# Patient Record
Sex: Male | Born: 2000 | Race: White | Hispanic: No | Marital: Single | Smoking: Never smoker
Health system: Southern US, Community
[De-identification: ages and names within clinical notes are randomized; demographics above are authoritative.]

## PROBLEM LIST (undated history)

## (undated) DIAGNOSIS — K219 Gastro-esophageal reflux disease without esophagitis: Secondary | ICD-10-CM

## (undated) HISTORY — DX: Gastro-esophageal reflux disease without esophagitis: K21.9

---
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
## 2018-07-16 ENCOUNTER — Encounter: Payer: Self-pay | Admitting: Orthopedic Surgery

## 2018-07-16 ENCOUNTER — Ambulatory Visit (INDEPENDENT_AMBULATORY_CARE_PROVIDER_SITE_OTHER): Payer: BLUE CROSS/BLUE SHIELD | Admitting: Orthopedic Surgery

## 2018-07-16 VITALS — BP 126/64 | HR 73 | Ht 68.75 in | Wt 153.0 lb

## 2018-07-16 DIAGNOSIS — S93432A Sprain of tibiofibular ligament of left ankle, initial encounter: Secondary | ICD-10-CM | POA: Diagnosis not present

## 2018-07-16 DIAGNOSIS — S93492A Sprain of other ligament of left ankle, initial encounter: Secondary | ICD-10-CM | POA: Diagnosis not present

## 2018-07-16 NOTE — Patient Instructions (Signed)
Ankle Sprain  An ankle sprain is a stretch or tear in one of the tough tissues (ligaments) that connect the bones in your ankle. An ankle sprain can happen when the ankle rolls outward (inversion sprain) or inward (eversion sprain). What are the causes? This condition is caused by rolling or twisting the ankle. What increases the risk? You are more likely to develop this condition if you play sports. What are the signs or symptoms? Symptoms of this condition include:  Pain in your ankle.  Swelling.  Bruising. This may happen right after you sprain your ankle or 1-2 days later.  Trouble standing or walking. How is this diagnosed? This condition is diagnosed with:  A physical exam. During the exam, your doctor will press on certain parts of your foot and ankle and try to move them in certain ways.  X-ray imaging. These may be taken to see how bad the sprain is and to check for broken bones. How is this treated? This condition may be treated with:  A brace or splint. This is used to keep the ankle from moving until it heals.  An elastic bandage. This is used to support the ankle.  Crutches.  Pain medicine.  Surgery. This may be needed if the sprain is very bad.  Physical therapy. This may help to improve movement in the ankle. Follow these instructions at home: If you have a brace or a splint:  Wear the brace or splint as told by your doctor. Remove it only as told by your doctor.  Loosen the brace or splint if your toes: ? Tingle. ? Lose feeling (become numb). ? Turn cold and blue.  Keep the brace or splint clean.  If the brace or splint is not waterproof: ? Do not let it get wet. ? Cover it with a watertight covering when you take a bath or a shower. If you have an elastic bandage (dressing):  Remove it to shower or bathe.  Try not to move your ankle much, but wiggle your toes from time to time. This helps to prevent swelling.  Adjust the dressing if it feels  too tight.  Loosen the dressing if your foot: ? Loses feeling. ? Tingles. ? Becomes cold and blue. Managing pain, stiffness, and swelling   Take over-the-counter and prescription medicines only as told by doctor.  For 2-3 days, keep your ankle raised (elevated) above the level of your heart.  If told, put ice on the injured area: ? If you have a removable brace or splint, remove it as told by your doctor. ? Put ice in a plastic bag. ? Place a towel between your skin and the bag. ? Leave the ice on for 20 minutes, 2-3 times a day. General instructions  Rest your ankle.  Do not use your injured leg to support your body weight until your doctor says that you can. Use crutches as told by your doctor.  Do not use any products that contain nicotine or tobacco, such as cigarettes, e-cigarettes, and chewing tobacco. If you need help quitting, ask your doctor.  Keep all follow-up visits as told by your doctor. Contact a doctor if:  Your bruises or swelling are quickly getting worse.  Your pain does not get better after you take medicine. Get help right away if:  You cannot feel your toes or foot.  Your foot or toes look blue.  You have very bad pain that gets worse. Summary  An ankle sprain is a stretch   or tear in one of the tough tissues (ligaments) that connect the bones in your ankle.  This condition is caused by rolling or twisting the ankle.  Symptoms include pain, swelling, bruising, and trouble walking.  To help with pain and swelling, put ice on the injured ankle, raise your ankle above the level of your heart, and use an elastic bandage. Also, rest as told by your doctor.  Keep all follow-up visits as told by your doctor. This is important. This information is not intended to replace advice given to you by your health care provider. Make sure you discuss any questions you have with your health care provider. Document Released: 12/12/2007 Document Revised: 11/19/2017  Document Reviewed: 11/19/2017 Elsevier Interactive Patient Education  2019 Elsevier Inc.  

## 2018-07-16 NOTE — Progress Notes (Signed)
NEW PATIENT OFFICE VISIT  Chief Complaint  Patient presents with  . 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  He did not feel a pop but had severe swelling and could not weight-bear at the time of injury.  He now comes in ambulating without a limp but continued pain swelling and inability to perform dance.  He has seen a doctor and a physical therapist but has been basically trying to do his own rehab  He has pain over the lateral and anterolateral ankle which is nonradiating localized moderate at times but primarily mild pain which is described as a dull ache   Review of Systems  Constitutional: Negative for chills and fever.  Neurological: Positive for tingling.     Past Medical History:  Diagnosis Date  . GERD (gastroesophageal reflux disease)     Past Surgical History:  Procedure Laterality Date  . HERNIA REPAIR  2000    Family History  Problem Relation Age of Onset  . Breast cancer Mother   . Breast cancer Maternal Grandmother   . Diabetes Maternal Grandmother   . Cancer Maternal Grandfather    Social History   Tobacco Use  . Smoking status: Never Smoker  . Smokeless tobacco: Never Used  Substance Use Topics  . Alcohol use: Never    Frequency: Never  . Drug use: Never    No Known Allergies  Current Meds  Medication Sig  . famotidine (PEPCID) 20 MG tablet Take 20 mg by mouth 2 (two) times daily.  Marland Kitchen ibuprofen (ADVIL,MOTRIN) 400 MG tablet Take 400 mg by mouth every 6 (six) hours as needed.    BP 126/64   Pulse 73   Ht 5' 8.75" (1.746 m)   Wt 153 lb (69.4 kg)   BMI 22.76 kg/m   Physical Exam Vitals signs reviewed.  Constitutional:      Appearance: He is well-developed.     Comments: Vital signs have been reviewed and are stable. Gen. appearance the patient is well-developed and well-nourished with normal grooming and hygiene.   Skin:  General: Skin is warm and dry.     Findings: No erythema.  Neurological:     Mental Status: He is alert and oriented to person, place, and time.     Gait: Gait abnormal.     Right Ankle Exam  Right ankle exam is normal.  Tenderness  The patient is experiencing no tenderness. Swelling: none  Range of Motion  The patient has normal right ankle ROM.  Muscle Strength  The patient has normal right ankle strength.  Tests  Anterior drawer: trace Varus tilt: negative  Other  Erythema: absent Scars: absent Sensation: normal Pulse: present    Left Ankle Exam   Tenderness  The patient is experiencing tenderness in the ATF (Toes are also tender and tib-fib interspace tender approximately up to the midshaft of the 2 bones).  Swelling: none  Range of Motion  Dorsiflexion: abnormal  Plantar flexion: normal  Eversion: abnormal  Inversion: normal   Muscle Strength  The patient has normal left ankle strength. Peroneal muscle:  4/5  Tests  Anterior drawer: trace Varus tilt: negative  Other  Erythema: absent Scars: absent Sensation: normal Pulse: present  Comments:  Ecchymosis is noted over the second and third metatarsophalangeal joints and there is pain with dorsi flexion and plantar flexion  MEDICAL DECISION SECTION  Xrays were done at Images of his foot and ankle and tib-fib were brought in from an outside facility  My independent reading of xrays:  The foot x-ray is negative  The ankle x-ray is normal  The tib-fib x-ray is normal    Encounter Diagnoses  Name Primary?  . Sprain of other ligament of left ankle, initial encounter Yes  . High ankle sprain of left lower extremity, initial encounter     PLAN: (Rx., injectx, surgery, frx, mri/ct) Rehabilitation with a physical therapist weight-bear as tolerated.  The patient was in a cam walker does not need that at this point work on dorsiflexion and proprioception follow-up as needed  No  orders of the defined types were placed in this encounter.   Fuller Canada, MD  07/17/2018 9:11 AM

## 2018-07-18 ENCOUNTER — Other Ambulatory Visit: Payer: Self-pay

## 2018-07-18 ENCOUNTER — Ambulatory Visit (HOSPITAL_COMMUNITY): Payer: BLUE CROSS/BLUE SHIELD | Attending: Orthopedic Surgery | Admitting: Physical Therapy

## 2018-07-18 ENCOUNTER — Encounter (HOSPITAL_COMMUNITY): Payer: Self-pay | Admitting: Physical Therapy

## 2018-07-18 DIAGNOSIS — M25672 Stiffness of left ankle, not elsewhere classified: Secondary | ICD-10-CM | POA: Diagnosis not present

## 2018-07-18 DIAGNOSIS — M25572 Pain in left ankle and joints of left foot: Secondary | ICD-10-CM | POA: Diagnosis present

## 2018-07-18 DIAGNOSIS — R29898 Other symptoms and signs involving the musculoskeletal system: Secondary | ICD-10-CM | POA: Diagnosis present

## 2018-07-18 NOTE — Patient Instructions (Signed)
Phase I . Isometric Sickle and wing - for first couple weeks for tissue heeling and to reduce swelling . Theraband exercises for dorsiflexion and plantarflexion . Ankle AROM exercises: ankle pumps, sickle/wing, ABCs . Using theraball bending and straightening knee . Compression for ankle swelling/ice . Avoid passive range into inversion or plantarflexion . Avoid jumping or other impact . Hip strengthening on mat or at bar Phase II . Passive ROM to gain last bit of range . Strengthening through full ROM progression from isometrics . Slowly progress plyometrics - Jumping progression: single leg relevae  double leg jumping  single leg jumping Phase III . Returning to all regular activity . Single leg jumping, single leg relevae  . Jogging

## 2018-07-18 NOTE — Therapy (Signed)
[facility] [address] [place], [place], [address] Phone: [phone]   Fax:  [phone]  Physical Therapy Evaluation  Patient Details  Name: Jesse Perkins MRN: [id number] Date of Birth: 2001-01-04 Referring Provider (PT): Fuller Canada MD   Encounter Date: 07/18/2018  PT End of Session - 07/18/18 1547    Visit Number  1    Number of Visits  1    Authorization Type  [employer] Other    Authorization Time Period  07/18/18    PT Start Time  1350    PT Stop Time  1440    PT Time Calculation (min)  50 min    Activity Tolerance  Patient tolerated treatment well;No increased pain    Behavior During Therapy  WFL for tasks assessed/performed       Past Medical History:  Diagnosis Date  . GERD (gastroesophageal reflux disease)     Past Surgical History:  Procedure Laterality Date  . HERNIA REPAIR  2000    There were no vitals filed for this visit.   Subjective Assessment - 07/18/18 1359    Subjective  Patient reported that he was dancing in the performance of the Nutcracker at [facility] in [facility], when he jumped up and landed on his foot which went inwards. Patient reported that his ankle swelled instantly, but that he didn't hear a pop. Patient reported that initially he was not able to walk on it, but 24 hours later he was able to walk on it. Patient stated that the outside and in the front of his shin is tender. Patient reported that it hurts a lot in the area where the retinaculum would be. He stated he used crutches for 1 day. Then didn't he wore the boot for about 3 days. He stated he has some bruising in between his toes. Patient reported he still has some swelling. Patient reported that he has been working with athletic trainers at the school to try and decrease the swelling.     Pertinent History  Injury of foot/ankle on 06/20/18    Limitations  Other (comment)   Dancing   How long can you sit comfortably?  Not limited     How long can you stand comfortably?  Not limited    How long can you walk comfortably?  Not limited    Diagnostic tests  X-rays negative for fracture    Patient Stated Goals  To return to dance    Currently in Pain?  No/denies         [facility] PT Assessment - 07/18/18 0001      Assessment   Medical Diagnosis  Left ankle sprain    Referring Provider (PT)  Fuller Canada MD    Onset Date/Surgical Date  06/20/18    Next MD Visit  Unknown    Prior Therapy  None      Restrictions   Weight Bearing Restrictions  No      Prior Function   Level of Independence  Independent;Independent with basic ADLs    Chartered certified accountant;Other (comment)   Dancer     Cognition   Overall Cognitive Status  Within Functional Limits for tasks assessed      Observation/Other Assessments   Observations  Noted swelling behind lateral malleolus of left ankle, bruising noted between 2nd and 3rd toe, no swelling noted in toes.      Observation/Other Assessments-Edema    Edema  Figure 8  Figure 8 Edema   Figure 8 - Right   51 cm    Figure 8 - Left   51.5 cm      Sensation   Light Touch  Appears Intact      ROM / Strength   AROM / PROM / Strength  AROM;Strength      AROM   AROM Assessment Site  Ankle    Right/Left Ankle  Right;Left    Right Ankle Dorsiflexion  15    Right Ankle Plantar Flexion  65    Right Ankle Inversion  25    Right Ankle Eversion  20    Left Ankle Plantar Flexion  55    Left Ankle Inversion  18    Left Ankle Eversion  13      Strength   Strength Assessment Site  Hip;Knee;Ankle    Right/Left Hip  --    Right/Left Knee  --    Right/Left Ankle  Right;Left    Right Ankle Dorsiflexion  5/5    Right Ankle Plantar Flexion  5/5    Right Ankle Inversion  5/5    Right Ankle Eversion  5/5    Left Ankle Dorsiflexion  5/5    Left Ankle Plantar Flexion  3-/5    Left Ankle Inversion  3-/5    Left Ankle Eversion  3-/5      Palpation   Palpation comment  Patient reported  tenderness to palpation behind lateral malleolus of left ankle, tenderness in anterior ankle      Special Tests    Special Tests  Ankle/Foot Special Tests    Ankle/Foot Special Tests   Anterior Drawer Test;Talar Tilt Test      Anterior Drawer Test   Findings  Negative    Comments  Also syndesmosis testing for high ankle sprain negative      Talar Tilt Test    Findings  Negative                Objective measurements completed on examination: See above findings.              PT Education - 07/18/18 1541    Education Details  Discussed examination findings, POC, and provided patient with a list of ideas for progression of exercises.    Person(s) Educated  Patient    Methods  Explanation;Handout    Comprehension  Verbalized understanding       PT Short Term Goals - 07/18/18 1544      PT SHORT TERM GOAL #1   Title  Patient will be educated on ideas for exercise progressions as well as on tissue healing times and how to protect tissues in order to continue performing progression of exercises at school with athletic trainers.     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 Upon evaluation patient demonstrated signs and symptoms consistent with an inversion ankle sprain including swelling, decreased ankle ROM, and decreased strength primarily in ankle inversion and plantarflexion. As patient is returning to school in [place] on Sunday, patient will not be able to  continue physical therapy. Patient requested to have ideas of progression of exercises in order to continue progressing with athletic trainers at school. Therapist educated patient on examination findings, about tissue healing  times, how to protect tissues as well as plans to progression and return to dance slowly and as tolerated. Therapist provided patient with a print out of general ideas for progression as well as provided the patient with the phone number for future contact if needed. Although, patient would benefit from physical therapy, this was a one time visit due to patient's schedule and returning to school.     History and Personal Factors relevant to plan of care:  Ankle sprain 06/20/18    Clinical Presentation  Stable    Clinical Presentation due to:  MMT, AROM, clinical judgement    Clinical Decision Making  Low    Rehab Potential  Good    PT Frequency  One time visit    PT Treatment/Interventions  ADLs/Self Care Home Management;Therapeutic exercise;Patient/family education    PT Next Visit Plan  One time visit    Recommended Other Services  Continue with athletic trainer and follow up with physician if not improving    Consulted and Agree with Plan of Care  Patient       Patient will benefit from skilled therapeutic intervention in order to improve the following deficits and impairments:  Decreased balance, Decreased endurance, Decreased mobility, Decreased range of motion, Increased edema, Decreased activity tolerance, Decreased strength, Pain  Visit Diagnosis: Stiffness of left ankle, not elsewhere classified  Pain in left ankle and joints of left foot  Other symptoms and signs involving the musculoskeletal system     Problem List There are no active problems to display for this patient.  Verne CarrowMacy Chiyo Fay PT, DPT 4:02 PM, 07/18/18 [phone]  [facility] [address], [place], [address] Phone: [phone]   Fax:  [phone]  Name: Jesse Perkins MRN: [id number] Date of Birth: [birth year]

## 2020-04-02 ENCOUNTER — Other Ambulatory Visit: Payer: Self-pay

## 2020-04-02 ENCOUNTER — Other Ambulatory Visit: Payer: BC Managed Care – PPO

## 2020-04-02 ENCOUNTER — Other Ambulatory Visit: Payer: Self-pay | Admitting: Internal Medicine

## 2020-04-02 DIAGNOSIS — Z20822 Contact with and (suspected) exposure to covid-19: Secondary | ICD-10-CM

## 2020-04-04 LAB — NOVEL CORONAVIRUS, NAA: SARS-CoV-2, NAA: NOT DETECTED

## 2020-04-04 LAB — SARS-COV-2, NAA 2 DAY TAT

## 2020-04-15 ENCOUNTER — Other Ambulatory Visit: Payer: BC Managed Care – PPO

## 2020-04-15 ENCOUNTER — Other Ambulatory Visit: Payer: Self-pay

## 2020-04-15 DIAGNOSIS — Z20822 Contact with and (suspected) exposure to covid-19: Secondary | ICD-10-CM

## 2020-04-16 LAB — SARS-COV-2, NAA 2 DAY TAT

## 2020-04-16 LAB — NOVEL CORONAVIRUS, NAA: SARS-CoV-2, NAA: NOT DETECTED

## 2022-02-04 NOTE — H&P (View-Only) (Signed)
Referring Provider:Self referred  Primary Care Physician:  Benita Stabile, MD Primary Gastroenterologist:  Dr. Jena Gauss  Chief Complaint  Patient presents with   Hemorrhoids    Constipation has a hard time having a bowel movement. Pressure in this prostate area. Taking MiraLax to help with bowel movement. Having trouble urinating. Bowel movements have white mucus.    HPI:   Jesse Perkins is a 21 y.o. male presenting today upon self referral for constipation, difficulty urinating, and prostate pressure.   He reports irregular BMs starting 7/18. Noticed internal hemorrhoids at that time as well per self internal exam. States he didn't have a BM for 4 days. When trying to have a bowel movement, states he would pass mucus only that would be clear, white in color, or sometimes blood-tinged mucus.  During this time, also developed difficulty urinating.  Having to strain.  Sometimes when standing to urinate, he had to sit, or vice versa.   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  [place] sea and he saw the doctor on board who did not have much to offer.  He then saw Dr. In the emergency room while in [place].  States today gave him a laxative and enema which allowed him to pass liquid stool.  He had associated generalized abdominal discomfort for couple of hours, but this resolved.  Doctor on board cruciated told him he had to go home to get cleared before coming back on board.  In transit to home, he was able to have a small BM.  He started using Preparation H for hemorrhoids.  States his hemorrhoids seem to have improved, no rectal pain, but he continues with pressure in his prostate area and difficulty urinating.  Denies dysuria or discharge.  He also started MiraLAX daily 3 to 4 days ago which has helped him have bowel movements that are soft and formed, but he still feels he is having incomplete evacuation.  Denies any further rectal bleeding.  He has no chronic history of constipation.  Reports  while on the group ship, it is hard to stay hydrated.  Otherwise, has not had any dietary changes.  He is very active.  He is sexually active with a male partner.  Reports he was sexually active on the cruise ship.  States his partner does not have any similar symptoms.  Reports he could have had some mucosal tearing/irritation related to intercourse, but nothing significant.  No abdominal pain, nausea, vomiting.   Father with history of IBS. No Fhx of colon cancer.   Past Medical History:  Diagnosis Date   GERD (gastroesophageal reflux disease)     Past Surgical History:  Procedure Laterality Date   HERNIA REPAIR  2000    Current Outpatient Medications  Medication Sig Dispense Refill   famotidine (PEPCID) 20 MG tablet Take 20 mg by mouth daily as needed.     ibuprofen (ADVIL,MOTRIN) 400 MG tablet Take 400 mg by mouth every 6 (six) hours as needed.     No current facility-administered medications for this visit.    Allergies as of 02/05/2022   (No Known Allergies)    Family History  Problem Relation Age of Onset   Breast cancer Mother    Breast cancer Maternal Grandmother    Diabetes Maternal Grandmother    Cancer Maternal Grandfather    Colon cancer Neg Hx     Social History   Socioeconomic History   Marital status: Single  Spouse name: Not on file   Number of children: Not on file   Years of education: Not on file   Highest education level: Not on file  Occupational History   Not on file  Tobacco Use   Smoking status: Never   Smokeless tobacco: Never  Substance and Sexual Activity   Alcohol use: Never   Drug use: Never   Sexual activity: Yes    Partners: Male    Birth control/protection: None  Other Topics Concern   Not on file  Social History Narrative   Not on file   Social Determinants of Health   Financial Resource Strain: Not on file  Food Insecurity: Not on file  Transportation Needs: Not on file  Physical Activity: Not on file  Stress:  Not on file  Social Connections: Not on file  Intimate Partner Violence: Not on file    Review of Systems: Gen: Denies any fever, chills, cold or flu like symptoms, pre-syncope, or syncope.   CV: Denies chest pain, heart palpitations. Resp: Denies shortness of breath, cough.  GI: See HPI GU : Denies urinary burning, urinary frequency, urinary hesitancy MS: Denies joint pain. Derm: Denies rash. Psych: Denies depression, anxiety. Heme: See HPI  Physical Exam: BP 116/75 (BP Location: Right Arm, Patient Position: Sitting, Cuff Size: Normal)   Pulse (!) 103   Temp 98.1 F (36.7 C) (Temporal)   Ht 5\' 9"  (1.753 m)   Wt 149 lb 3.2 oz (67.7 kg)   BMI 22.03 kg/m  General:   Alert and oriented. Pleasant and cooperative. Well-nourished and well-developed.  Head:  Normocephalic and atraumatic. Eyes:  Without icterus, sclera clear and conjunctiva pink.  Ears:  Normal auditory acuity. Lungs:  Clear to auscultation bilaterally. No wheezes, rales, or rhonchi. No distress.  Heart:  S1, S2 present without murmurs appreciated.  Abdomen:  +BS, soft, non-tender and non-distended. No HSM noted. No guarding or rebound. No masses appreciated.  Rectal:  No abnormalities on external exam, no anal fissure, possible internal hemorrhoids, mild bulge anteriorly that patient reports is uncomfortable to palpation described as a lot of pressure. Blood tinged mucous on gloved exam finger. No stool in rectal vault.  Msk:  Symmetrical without gross deformities. Normal posture. Extremities:  Without edema. Neurologic:  Alert and  oriented x4;  grossly normal neurologically. Skin:  Intact without significant lesions or rashes. Psych:  Normal mood and affect.    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   Patient developed new onset constipation on  7/18 while working on the cruise ship.  Also reports noticing internal hemorrhoids during this time, couple episodes of passing blood tinged mucous per rectum while unable to have a BM, pressure in the prostate area, and difficulty urinating without dysuria or abnormal urethral discharge. He has been using preparation H for hemorrhoids with improvement in hemorrhoid symptoms, MiraLAX daily for the last 3 days with some improvement in constipation, but still with incomplete bowel movement. Despite bowels movements, he has not had any improvement in pressure in his prostate or dysuria. On DRE, he has no external abnormalities, no stool in rectal vault, possible small internal hemorrhoids, and mild bulge anteriorly that patient reports is uncomfortable to palpation described as a lot of pressure. I suspect I am palpating his prostate. On gloved exam finger, there was blood tinged mucous. Upon further discussion, patient reports  he is sexually active with a male including recent activity while on the cruise ship that he reports may have resulted in some mucosal tearing/mild irritation, but nothing significant. He is not using protection.   His constipation may have been influenced by mild dehydration as he reports it is difficult to stay hydrated while on the cruise ship. Rectal bleeding may be secondary to hemorrhoids, anorectal trauma in the setting of anal intercourse. Unable to rule out other etiologies such as malignancy, but I feel this is less likely. My primary concern today is for possible prostatitis contributing to prostate tenderness and urinary symptoms.   I will plan to update blood work, check UA, culture, and gram stain; however, as I do not treat prostatitis, advised patient call his PCP today to discuss further. I am not comfortable initiating empiric antibiotics as he is at risk for gonorrhea/chlamydial infection.  Regarding his constipation, recommended increasing MiraLAX to twice daily dosing.    I offered a colonoscopy to evaluate his rectal bleeding, but patient requested to think on this and would let me know his decision in the coming days. For now, he can continue Preparation H as needed.    Plan:  CBC, BMP, UA, urine culture, and urine gram stain Increase MiraLAX to 17 g twice daily in 8 oz of water.  Drink at least 64 ox of water daily.  Continue preparation H twice daily as needed for hemorrhoids.  Patient will let me know if he would like to proceed with a colonoscopy.  He is to call his PCP today to discuss concerns regarding prostatitis/urinary issues.  Requested 1 week progress report on constipation.    Ermalinda Memos, PA-C [facility] 02/05/2022

## 2022-02-04 NOTE — Progress Notes (Unsigned)
   Referring Provider:Self referred  Primary Care Physician:  Benita Stabile, MD Primary Gastroenterologist:  Dr. Jena Gauss  No chief complaint on file.   HPI:   Jesse Perkins is a 21 y.o. male presenting today upon self referral for constipation and rectal pain.     Past Medical History:  Diagnosis Date   GERD (gastroesophageal reflux disease)     Past Surgical History:  Procedure Laterality Date   HERNIA REPAIR  2000    Current Outpatient Medications  Medication Sig Dispense Refill   famotidine (PEPCID) 20 MG tablet Take 20 mg by mouth 2 (two) times daily.     ibuprofen (ADVIL,MOTRIN) 400 MG tablet Take 400 mg by mouth every 6 (six) hours as needed.     No current facility-administered medications for this visit.    Allergies as of 02/05/2022   (No Known Allergies)    Family History  Problem Relation Age of Onset   Breast cancer Mother    Breast cancer Maternal Grandmother    Diabetes Maternal Grandmother    Cancer Maternal Grandfather     Social History   Socioeconomic History   Marital status: Single    Spouse name: Not on file   Number of children: Not on file   Years of education: Not on file   Highest education level: Not on file  Occupational History   Not on file  Tobacco Use   Smoking status: Never   Smokeless tobacco: Never  Substance and Sexual Activity   Alcohol use: Never   Drug use: Never   Sexual activity: Not on file  Other Topics Concern   Not on file  Social History Narrative   Not on file   Social Determinants of Health   Financial Resource Strain: Not on file  Food Insecurity: Not on file  Transportation Needs: Not on file  Physical Activity: Not on file  Stress: Not on file  Social Connections: Not on file  Intimate Partner Violence: Not on file    Review of Systems: Gen: Denies any fever, chills, cold or flu like symptoms, pre-syncope, or syncope.   CV: Denies chest pain, heart palpitations. Resp: Denies shortness of  breath, cough.  GI: See HPI GU : Denies urinary burning, urinary frequency, urinary hesitancy MS: Denies joint pain. Derm: Denies rash. Psych: Denies depression, anxiety. Heme: See HPI  Physical Exam: There were no vitals taken for this visit. General:   Alert and oriented. Pleasant and cooperative. Well-nourished and well-developed.  Head:  Normocephalic and atraumatic. Eyes:  Without icterus, sclera clear and conjunctiva pink.  Ears:  Normal auditory acuity. Lungs:  Clear to auscultation bilaterally. No wheezes, rales, or rhonchi. No distress.  Heart:  S1, S2 present without murmurs appreciated.  Abdomen:  +BS, soft, non-tender and non-distended. No HSM noted. No guarding or rebound. No masses appreciated.  Rectal:  Deferred  Msk:  Symmetrical without gross deformities. Normal posture. Extremities:  Without edema. Neurologic:  Alert and  oriented x4;  grossly normal neurologically. Skin:  Intact without significant lesions or rashes. Psych:  Normal mood and affect.    Assessment:     Plan:  ***   Ermalinda Memos, PA-C [facility] 02/05/2022

## 2022-02-05 ENCOUNTER — Encounter: Payer: Self-pay | Admitting: Gastroenterology

## 2022-02-05 ENCOUNTER — Ambulatory Visit (INDEPENDENT_AMBULATORY_CARE_PROVIDER_SITE_OTHER): Payer: BC Managed Care – PPO | Admitting: Gastroenterology

## 2022-02-05 VITALS — BP 116/75 | HR 103 | Temp 98.1°F | Ht 69.0 in | Wt 149.2 lb

## 2022-02-05 DIAGNOSIS — R39198 Other difficulties with micturition: Secondary | ICD-10-CM

## 2022-02-05 DIAGNOSIS — K5900 Constipation, unspecified: Secondary | ICD-10-CM

## 2022-02-05 DIAGNOSIS — R6889 Other general symptoms and signs: Secondary | ICD-10-CM

## 2022-02-05 DIAGNOSIS — K625 Hemorrhage of anus and rectum: Secondary | ICD-10-CM

## 2022-02-05 NOTE — Patient Instructions (Signed)
Please have blood work and urine testing completed at [facility].  Please call Dr. Margo Aye today to follow-up on suspected prostatitis (inflammation of your prostate). You may need a course of antibiotics.   Increase MiraLAX to 17 g in 8 ounces of water twice daily to help with your constipation.   Be sure to drink at least 64 ounces of water daily.  You may continue to use Preparation H twice daily for hemorrhoids.  Please let me know if you would like to proceed with a colonoscopy.  Call me in 1 week with a progress report on your constipation.  It was nice meeting you today!   Ermalinda Memos, PA-C [facility]

## 2022-02-06 LAB — CBC WITH DIFFERENTIAL/PLATELET
Eosinophils Absolute: 108 cells/uL (ref 15–500)
Eosinophils Relative: 1.5 %
HCT: 48.1 % (ref 38.5–50.0)
Lymphs Abs: 1570 cells/uL (ref 850–3900)
MPV: 10.4 fL (ref 7.5–12.5)
Monocytes Relative: 5 %
Neutro Abs: 5119 cells/uL (ref 1500–7800)
Neutrophils Relative %: 71.1 %
Platelets: 185 10*3/uL (ref 140–400)
RBC: 5.49 10*6/uL (ref 4.20–5.80)
RDW: 11.8 % (ref 11.0–15.0)
Total Lymphocyte: 21.8 %
WBC: 7.2 10*3/uL (ref 3.8–10.8)

## 2022-02-07 ENCOUNTER — Telehealth: Payer: Self-pay | Admitting: *Deleted

## 2022-02-07 LAB — URINALYSIS
Bilirubin Urine: NEGATIVE
Glucose, UA: NEGATIVE
Hgb urine dipstick: NEGATIVE
Ketones, ur: NEGATIVE
Leukocytes,Ua: NEGATIVE
Nitrite: NEGATIVE
Protein, ur: NEGATIVE
Specific Gravity, Urine: 1.004 (ref 1.001–1.035)
pH: 8 (ref 5.0–8.0)

## 2022-02-07 LAB — BASIC METABOLIC PANEL WITH GFR
BUN: 12 mg/dL (ref 7–25)
CO2: 31 mmol/L (ref 20–32)
Calcium: 10 mg/dL (ref 8.6–10.3)
Chloride: 98 mmol/L (ref 98–110)
Creat: 0.98 mg/dL (ref 0.60–1.24)
Glucose, Bld: 83 mg/dL (ref 65–99)
Potassium: 4.6 mmol/L (ref 3.5–5.3)
Sodium: 137 mmol/L (ref 135–146)
eGFR: 111 mL/min/{1.73_m2} (ref >=60)

## 2022-02-07 LAB — URINE CULTURE
MICRO NUMBER:: 13721032
Result:: NO GROWTH
SPECIMEN QUALITY:: ADEQUATE

## 2022-02-07 LAB — CBC WITH DIFFERENTIAL/PLATELET
Absolute Monocytes: 360 cells/uL (ref 200–950)
Basophils Absolute: 43 cells/uL (ref 0–200)
Basophils Relative: 0.6 %
Hemoglobin: 16.6 g/dL (ref 13.2–17.1)
MCH: 30.2 pg (ref 27.0–33.0)
MCHC: 34.5 g/dL (ref 32.0–36.0)
MCV: 87.6 fL (ref 80.0–100.0)

## 2022-02-07 MED ORDER — PEG 3350-KCL-NA BICARB-NACL 420 G PO SOLR: 4000.0000 mL | Freq: Once | ORAL | 0 refills | Status: AC

## 2022-02-07 NOTE — Telephone Encounter (Signed)
Called pt. He has been scheduled for 8/7 at 7:30am. Aware will send instructions via mtchart and rx sent to pharmacy.

## 2022-02-07 NOTE — Telephone Encounter (Signed)
Please proceed with scheduling colonoscopy with propofol with Dr. Jena Gauss. We need to try to get this done fairly quickly as he is on a bit of a time crunch with work.  ASA 2 Dx: Rectal bleeding, rectal pain, constipation

## 2022-02-07 NOTE — Telephone Encounter (Signed)
Pt called in. He is ready to proceed with scheduling a colonoscopy. Please advise Baxter Hire thanks

## 2022-02-07 NOTE — Addendum Note (Signed)
Addended by: Armstead Peaks on: 02/07/2022 04:11 PM   Modules accepted: Orders

## 2022-02-08 ENCOUNTER — Telehealth: Payer: Self-pay | Admitting: *Deleted

## 2022-02-08 NOTE — Telephone Encounter (Signed)
Called pt and mom. Aware of recs.

## 2022-02-08 NOTE — Telephone Encounter (Signed)
Pt called in. He was put on cipro for inflamed prostate by PCP 02/06/22. He will also have to start flomax. He wants to know if he needs to stop the cipro for his procedure on Monday? 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 Please advise Baxter Hire thanks!

## 2022-02-08 NOTE — Telephone Encounter (Signed)
Colonoscopy is for rectal bleeding and recent change in bowel habits with constipation. Also found to have prostatitis, started on Cipro, and apparently HSV as well. I don't think this is a contraindication to proceeding with a colonoscopy, but will ask for Dr. Jena Gauss to weigh in. Little bit of a difficult situation as patient was hoping to have testing completed within the next few weeks as he is unable to work until he has been cleared.    Dr. Jena Gauss, please let me know what you think.

## 2022-02-12 ENCOUNTER — Ambulatory Visit (HOSPITAL_COMMUNITY)
Admission: RE | Admit: 2022-02-12 | Discharge: 2022-02-12 | Disposition: A | Discharge status: Home / Self Care | Payer: BC Managed Care – PPO | Attending: Internal Medicine | Admitting: Internal Medicine

## 2022-02-12 ENCOUNTER — Encounter (HOSPITAL_COMMUNITY)
Admission: RE | Disposition: A | Discharge status: Home / Self Care | Payer: Self-pay | Source: Home / Self Care | Attending: Internal Medicine

## 2022-02-12 ENCOUNTER — Encounter (HOSPITAL_COMMUNITY): Payer: Self-pay | Admitting: Internal Medicine

## 2022-02-12 ENCOUNTER — Other Ambulatory Visit: Payer: Self-pay

## 2022-02-12 ENCOUNTER — Ambulatory Visit (HOSPITAL_COMMUNITY): Payer: BC Managed Care – PPO | Admitting: Anesthesiology

## 2022-02-12 DIAGNOSIS — K6289 Other specified diseases of anus and rectum: Secondary | ICD-10-CM | POA: Insufficient documentation

## 2022-02-12 DIAGNOSIS — K626 Ulcer of anus and rectum: Secondary | ICD-10-CM

## 2022-02-12 DIAGNOSIS — K51211 Ulcerative (chronic) proctitis with rectal bleeding: Secondary | ICD-10-CM | POA: Diagnosis not present

## 2022-02-12 DIAGNOSIS — Z79899 Other long term (current) drug therapy: Secondary | ICD-10-CM | POA: Insufficient documentation

## 2022-02-12 DIAGNOSIS — R194 Change in bowel habit: Secondary | ICD-10-CM

## 2022-02-12 DIAGNOSIS — K5900 Constipation, unspecified: Secondary | ICD-10-CM | POA: Diagnosis not present

## 2022-02-12 DIAGNOSIS — K625 Hemorrhage of anus and rectum: Secondary | ICD-10-CM

## 2022-02-12 DIAGNOSIS — K921 Melena: Secondary | ICD-10-CM

## 2022-02-12 DIAGNOSIS — K648 Other hemorrhoids: Secondary | ICD-10-CM | POA: Insufficient documentation

## 2022-02-12 HISTORY — PX: COLONOSCOPY WITH PROPOFOL: SHX5780

## 2022-02-12 HISTORY — PX: BIOPSY: SHX5522

## 2022-02-12 SURGERY — COLONOSCOPY WITH PROPOFOL
Anesthesia: General

## 2022-02-12 MED ORDER — PROPOFOL 10 MG/ML IV BOLUS
INTRAVENOUS | Status: DC | PRN
  Administered 2022-02-12 (×2): 50 mg via INTRAVENOUS
  Administered 2022-02-12: 100 mg via INTRAVENOUS
  Administered 2022-02-12: 50 mg via INTRAVENOUS

## 2022-02-12 MED ORDER — LACTATED RINGERS IV SOLN
INTRAVENOUS | Status: DC
Start: 2022-02-12 — End: 2022-02-12
  Administered 2022-02-12: 1000 mL via INTRAVENOUS

## 2022-02-12 MED ORDER — LACTATED RINGERS IV SOLN: INTRAVENOUS | Status: DC | PRN

## 2022-02-12 MED ORDER — PROPOFOL 500 MG/50ML IV EMUL
INTRAVENOUS | Status: DC | PRN
  Administered 2022-02-12: 250 ug/kg/min via INTRAVENOUS
  Administered 2022-02-12: 200 ug/kg/min via INTRAVENOUS
  Administered 2022-02-12: 225 ug/kg/min via INTRAVENOUS

## 2022-02-12 MED ORDER — LIDOCAINE HCL (CARDIAC) PF 100 MG/5ML IV SOSY
PREFILLED_SYRINGE | INTRAVENOUS | Status: DC | PRN
  Administered 2022-02-12: 60 mg via INTRATRACHEAL

## 2022-02-12 NOTE — Interval H&P Note (Signed)
History and Physical Interval Note:  02/12/2022 7:40 AM  Jesse Perkins  has presented today for surgery, with the diagnosis of rectal bleeding, rectal pain, constipation.  The various methods of treatment have been discussed with the patient and family. After consideration of risks, benefits and other options for treatment, the patient has consented to  Procedure(s) with comments: COLONOSCOPY WITH PROPOFOL (N/A) - 7:30am, asa 2 as a surgical intervention.  The patient's history has been reviewed, patient examined, no change in status, stable for surgery.  I have reviewed the patient's chart and labs.  Questions were answered to the patient's satisfaction.     Jesse Perkins  No change.  Saw PCP treated empirically for prostatitis-on day 4 of 14-day course of Cipro.  Diagnostic colonoscopy today per plan. The risks, benefits, limitations, alternatives and imponderables have been reviewed with the patient. Questions have been answered. All parties are agreeable.

## 2022-02-12 NOTE — Transfer of Care (Signed)
Immediate Anesthesia Transfer of Care Note  Patient: SAW MENDENHALL  Procedure(s) Performed: COLONOSCOPY WITH PROPOFOL BIOPSY  Patient Location: Short Stay  Anesthesia Type:General  Level of Consciousness: sedated  Airway & Oxygen Therapy: Patient Spontanous Breathing  Post-op Assessment: Report given to RN and Post -op Vital signs reviewed and stable  Post vital signs: Reviewed and stable  Last Vitals:  Vitals Value Taken Time  BP 124/80   Temp 36   Pulse 74   Resp 16   SpO2 98     Last Pain:  Vitals:   02/12/22 0745  TempSrc:   PainSc: 0-No pain      Patients Stated Pain Goal: 6 (02/12/22 0715)  Complications: No notable events documented.

## 2022-02-12 NOTE — Anesthesia Postprocedure Evaluation (Signed)
Anesthesia Post Note  Patient: Jesse Perkins  Procedure(s) Performed: COLONOSCOPY WITH PROPOFOL BIOPSY  Patient location during evaluation: Phase II Anesthesia Type: General Level of consciousness: awake and alert and oriented Pain management: pain level controlled Vital Signs Assessment: post-procedure vital signs reviewed and stable Respiratory status: spontaneous breathing, nonlabored ventilation and respiratory function stable Cardiovascular status: blood pressure returned to baseline and stable Postop Assessment: no apparent nausea or vomiting Anesthetic complications: no   No notable events documented.   Last Vitals:  Vitals:   02/12/22 0715 02/12/22 0806  BP: 132/72 (!) 101/42  Pulse: 73 71  Resp: 10 (!) 21  Temp: 36.9 C 36.6 C  SpO2: 100% 97%    Last Pain:  Vitals:   02/12/22 0812  TempSrc:   PainSc: 0-No pain                 Hollan Philipp C Odai Wimmer

## 2022-02-12 NOTE — Op Note (Signed)
[facility] Patient Name: Jesse Perkins Procedure Date: 02/12/2022 7:15 AM MRN: [id number] Date of Birth: 11/21/2000 Attending MD: Gennette Pac , MD CSN: [id number] Age: 21 Admit Type: Outpatient Procedure:                Colonoscopy Indications:              Hematochezia, Change in bowel habits Providers:                Gennette Pac, MD, Jesse Montgomery RN, RN,                            Jesse Vaught, RN, Jesse Perkins. Jesse Perkins, Technician Referring MD:              Medicines:                Propofol per Anesthesia Complications:            No immediate complications. Estimated Blood Loss:     Estimated blood loss was minimal. Procedure:                Pre-Anesthesia Assessment:                           - Prior to the procedure, a History and Physical                            was performed, and patient medications and                            allergies were reviewed. The patient's tolerance of                            previous anesthesia was also reviewed. The risks                            and benefits of the procedure and the sedation                            options and risks were discussed with the patient.                            All questions were answered, and informed consent                            was obtained. Prior Anticoagulants: The patient has                            taken no previous anticoagulant or antiplatelet                            agents. ASA Grade Assessment: II - A patient with                            mild systemic disease. After reviewing the risks  and benefits, the patient was deemed in                            satisfactory condition to undergo the procedure.                           After obtaining informed consent, the colonoscope                            was passed under direct vision. Throughout the                            procedure, the patient's blood pressure, pulse, and                             oxygen saturations were monitored continuously. The                            [id number]) scope was introduced through the                            anus and advanced to the the cecum, identified by                            appendiceal orifice and ileocecal valve. The                            colonoscopy was performed without difficulty. The                            patient tolerated the procedure well. The quality                            of the bowel preparation was adequate. Scope In: 7:48:47 AM Scope Out: 8:02:48 AM Scope Withdrawal Time: 0 hours 6 minutes 43 seconds  Total Procedure Duration: 0 hours 14 minutes 1 second  Findings:      The perianal and digital rectal examinations were normal. Single a 2 x 2       centimeter area of distal ulceration (see photos). Remainder recommend       because appeared normal.      The colon (entire examined portion) appeared normal.      The retroflexed view of the distal rectum and anal verge was normal and       showed no anal or rectal abnormalities. Biopsies of the rectal       ulceration taken. Impression:               - The entire examined colon is normal. Distal                            rectal ulcer?"status post biopsy                           -Remainder of the rectum and colon appeared normal Moderate Sedation:      Moderate (conscious) sedation was  personally administered by an       anesthesia professional. The following parameters were monitored: oxygen       saturation, heart rate, blood pressure, and response to care. Recommendation:           - Patient has a contact number available for                            emergencies. The signs and symptoms of potential                            delayed complications were discussed with the                            patient. Return to normal activities tomorrow.                            Written discharge instructions were provided to the                             patient.                           - Advance diet as tolerated. Follow-up on pathology                            (pathologist alerted. History of anal receptive                            intercourse). Further recommendations to follow. Procedure Code(s):        --- Professional ---                           [phone], Colonoscopy, flexible; diagnostic, including                            collection of specimen(s) by brushing or washing,                            when performed (separate procedure) Diagnosis Code(s):        --- Professional ---                           K92.1, Melena (includes Hematochezia)                           R19.4, Change in bowel habit CPT copyright 2019 [employer] Medical [employer]. All rights reserved. The codes documented in this report are preliminary and upon coder review may  be revised to meet current compliance requirements. Jesse Perkins. Jesse Malburg, MD Gennette Pac, MD 02/12/2022 8:16:42 AM This report has been signed electronically. Number of Addenda: 0

## 2022-02-12 NOTE — Discharge Instructions (Addendum)
  Colonoscopy Discharge Instructions  Read the instructions outlined below and refer to this sheet in the next few weeks. These discharge instructions provide you with general information on caring for yourself after you leave the hospital. Your doctor may also give you specific instructions. While your treatment has been planned according to the most current medical practices available, unavoidable complications occasionally occur. If you have any problems or questions after discharge, call Dr. Jena Gauss at [phone]. ACTIVITY You may resume your regular activity, but move at a slower pace for the next 24 hours.  Take frequent rest periods for the next 24 hours.  Walking will help get rid of the air and reduce the bloated feeling in your belly (abdomen).  No driving for 24 hours (because of the medicine (anesthesia) used during the test).   Do not sign any important legal documents or operate any machinery for 24 hours (because of the anesthesia used during the test).  NUTRITION Drink plenty of fluids.  You may resume your normal diet as instructed by your doctor.  Begin with a light meal and progress to your normal diet. Heavy or fried foods are harder to digest and may make you feel sick to your stomach (nauseated).  Avoid alcoholic beverages for 24 hours or as instructed.  MEDICATIONS You may resume your normal medications unless your doctor tells you otherwise.  WHAT YOU CAN EXPECT TODAY Some feelings of bloating in the abdomen.  Passage of more gas than usual.  Spotting of blood in your stool or on the toilet paper.  IF YOU HAD POLYPS REMOVED DURING THE COLONOSCOPY: No aspirin products for 7 days or as instructed.  No alcohol for 7 days or as instructed.  Eat a soft diet for the next 24 hours.  FINDING OUT THE RESULTS OF YOUR TEST Not all test results are available during your visit. If your test results are not back during the visit, make an appointment with your caregiver to find out the  results. Do not assume everything is normal if you have not heard from your caregiver or the medical facility. It is important for you to follow up on all of your test results.  SEEK IMMEDIATE MEDICAL ATTENTION IF: You have more than a spotting of blood in your stool.  Your belly is swollen (abdominal distention).  You are nauseated or vomiting.  You have a temperature over 101.  You have abdominal pain or discomfort that is severe or gets worse throughout the day.    Your rectum is inflamed.  Biopsies were taken.  Otherwise, your colon looked fine.  You may pass a little more blood over the next 24 hours  Further recommendations to follow when I get the lab report back  At patient request, I called Marylene Land at [phone] -reviewed findings and recommendations

## 2022-02-12 NOTE — Anesthesia Preprocedure Evaluation (Addendum)
Anesthesia Evaluation  Patient identified by MRN, date of birth, ID band Patient awake    Reviewed: Allergy & Precautions, NPO status , Patient's Chart, lab work & pertinent test results  Airway Mallampati: I  TM Distance: >3 FB Neck ROM: Full    Dental  (+) Dental Advisory Given, Teeth Intact   Pulmonary neg pulmonary ROS, Patient abstained from smoking.,    Pulmonary exam normal breath sounds clear to auscultation       Cardiovascular negative cardio ROS Normal cardiovascular exam Rhythm:Regular Rate:Normal     Neuro/Psych negative neurological ROS  negative psych ROS   GI/Hepatic Neg liver ROS, GERD  Medicated and Controlled,  Endo/Other  negative endocrine ROS  Renal/GU negative Renal ROS  negative genitourinary   Musculoskeletal negative musculoskeletal ROS (+)   Abdominal   Peds negative pediatric ROS (+)  Hematology negative hematology ROS (+)   Anesthesia Other Findings   Reproductive/Obstetrics negative OB ROS                            Anesthesia Physical Anesthesia Plan  ASA: 1  Anesthesia Plan: General   Post-op Pain Management: Minimal or no pain anticipated   Induction: Intravenous  PONV Risk Score and Plan: Propofol infusion  Airway Management Planned: Nasal Cannula and Natural Airway  Additional Equipment:   Intra-op Plan:   Post-operative Plan:   Informed Consent: I have reviewed the patients History and Physical, chart, labs and discussed the procedure including the risks, benefits and alternatives for the proposed anesthesia with the patient or authorized representative who has indicated his/her understanding and acceptance.     Dental advisory given  Plan Discussed with: CRNA and Surgeon  Anesthesia Plan Comments:        Anesthesia Quick Evaluation

## 2022-02-14 LAB — SURGICAL PATHOLOGY

## 2022-02-14 NOTE — Progress Notes (Signed)
Noted  

## 2022-02-15 ENCOUNTER — Other Ambulatory Visit: Payer: Self-pay

## 2022-02-15 MED ORDER — MESALAMINE 1000 MG RE SUPP: 1000.0000 mg | Freq: Every day | RECTAL | 0 refills | Status: DC

## 2022-02-16 ENCOUNTER — Encounter (HOSPITAL_COMMUNITY): Payer: Self-pay | Admitting: Internal Medicine

## 2022-03-06 NOTE — Progress Notes (Unsigned)
Referring Provider: Benita Stabile, MD Primary Care Physician:  Benita Stabile, MD Primary GI Physician: Dr. Jena Gauss  No chief complaint on file.   HPI:   Jesse Perkins is a 21 y.o. male presenting today for follow-up of rectal bleeding and constipation.   Last seen in our office 02/05/2022 at the time of initial consultation for constipation, rectal bleeding, difficulty urinating.  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  Also had a couple episodes of passing blood-tinged mucus per rectum while unable to have a bowel movement, pressure in the prostate area, difficulty urinating without dysuria or abnormal urethral discharge.  He was using MiraLAX for the last 3 days with some improvement in constipation, but still with incomplete bowel movements.  Despite bowels moving, no improvement in prostate pressure or urination.  On rectal exam, he had no external abnormalities, noted mild bulging anteriorly that patient reported was uncomfortable to palpation described as pressure.  Blood was seen on gloved exam finger.  After further discussion, patient admitted to anal intercourse recently that may have resulted in some mucosal tearing/mild irritation.  Plan to update blood work, UA, increase MiraLAX to twice daily, colonoscopy, call PCP to discuss possible prostatitis contributing to prostate tenderness and urinary symptoms.  Laboratory evaluation unremarkable.  UA negative.  He did have follow-up with PCP and was started on Cipro for prostatitis as well as Flomax.  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  Colonoscopy 02/12/2022: Single 2 x 2 centimeter area of distal ulceration in the rectum biopsied, otherwise colon appeared normal.  Pathology with severe acute nonspecific proctitis with ulceration and associated reactive changes.  Differential included infection, trauma, drug effect, sterile coral proctitis.  Immunohistochemical stain for  Treponema pallidum is negative.  Dr. Luvenia Starch recommendations were to avoid constipation, avoid anorectal trauma, 1 month course of mesalamine suppository at bedtime, follow-up in 1 month.  Today:    Past Medical History:  Diagnosis Date   GERD (gastroesophageal reflux disease)     Past Surgical History:  Procedure Laterality Date   BIOPSY  02/12/2022   Procedure: BIOPSY;  Surgeon: Corbin Ade, MD;  Location: [facility];  Service: Endoscopy;;   COLONOSCOPY WITH PROPOFOL N/A 02/12/2022   Procedure: COLONOSCOPY WITH PROPOFOL;  Surgeon: Corbin Ade, MD;  Location: [facility];  Service: Endoscopy;  Laterality: N/A;  7:30am, asa 2   HERNIA REPAIR  2000    Current Outpatient Medications  Medication Sig Dispense Refill   ciprofloxacin (CIPRO) 500 MG tablet Take 500 mg by mouth 2 (two) times daily.     clobetasol (TEMOVATE) 0.05 % external solution Apply 1 Application topically 2 (two) times daily.     famotidine (PEPCID) 20 MG tablet Take 20 mg by mouth daily as needed for heartburn or indigestion.     fluticasone (CUTIVATE) 0.05 % cream Apply 1 Application topically 2 (two) times daily as needed (Alopecia).     ibuprofen (ADVIL,MOTRIN) 400 MG tablet Take 400 mg by mouth every 6 (six) hours as needed for mild pain, moderate pain or headache.     ketoconazole (NIZORAL) 2 % cream Apply 1 Application topically daily as needed (Alopecia).     mesalamine (CANASA) 1000 MG suppository Place 1 suppository (1,000 mg total) rectally at bedtime. 30 suppository 0   polyethylene glycol (MIRALAX / GLYCOLAX) 17 g packet Take 17 g by mouth 2 (two) times daily.  triamcinolone cream (KENALOG) 0.1 % Apply 1 Application topically daily as needed (Alopecia).     No current facility-administered medications for this visit.    Allergies as of 03/07/2022   (No Known Allergies)    Family History  Problem Relation Age of Onset   Breast cancer Mother    Breast cancer Maternal Grandmother     Diabetes Maternal Grandmother    Cancer Maternal Grandfather    Colon cancer Neg Hx     Social History   Socioeconomic History   Marital status: Single    Spouse name: Not on file   Number of children: Not on file   Years of education: Not on file   Highest education level: Not on file  Occupational History   Not on file  Tobacco Use   Smoking status: Never   Smokeless tobacco: Never  Vaping Use   Vaping Use: Some days  Substance and Sexual Activity   Alcohol use: Yes    Comment: socially   Drug use: Never   Sexual activity: Yes    Partners: Male    Birth control/protection: None  Other Topics Concern   Not on file  Social History Narrative   Not on file   Social Determinants of Health   Financial Resource Strain: Not on file  Food Insecurity: Not on file  Transportation Needs: Not on file  Physical Activity: Not on file  Stress: Not on file  Social Connections: Not on file    Review of Systems: Gen: Denies fever, chills, cold or flu like symptoms, pre-syncope, or syncope.  CV: Denies chest pain, palpitations. Resp: Denies dyspnea, cough.  GI: See HPI  Heme: See HPI  Physical Exam: There were no vitals taken for this visit. General:   Alert and oriented. No distress noted. Pleasant and cooperative.  Head:  Normocephalic and atraumatic. Eyes:  Conjuctiva clear without scleral icterus. Heart:  S1, S2 present without murmurs appreciated. Lungs:  Clear to auscultation bilaterally. No wheezes, rales, or rhonchi. No distress.  Abdomen:  +BS, soft, non-tender and non-distended. No rebound or guarding. No HSM or masses noted. Msk:  Symmetrical without gross deformities. Normal posture. Extremities:  Without edema. Neurologic:  Alert and  oriented x4 Psych:  Normal mood and affect.    Assessment:     Plan:  ***   Ermalinda Memos, PA-C [facility] 03/07/2022

## 2022-03-07 ENCOUNTER — Encounter: Payer: Self-pay | Admitting: Gastroenterology

## 2022-03-07 ENCOUNTER — Ambulatory Visit (INDEPENDENT_AMBULATORY_CARE_PROVIDER_SITE_OTHER): Payer: BC Managed Care – PPO | Admitting: Gastroenterology

## 2022-03-07 VITALS — BP 129/87 | HR 92 | Temp 97.8°F | Ht 69.0 in | Wt 149.2 lb

## 2022-03-07 DIAGNOSIS — K626 Ulcer of anus and rectum: Secondary | ICD-10-CM

## 2022-03-07 DIAGNOSIS — K5900 Constipation, unspecified: Secondary | ICD-10-CM

## 2022-03-07 NOTE — Patient Instructions (Signed)
For mild residual constipation, start Benefiber or Metamucil daily.  You may follow the instructions on the container.  Continue taking MiraLAX 17 g as needed.  You may take this daily if Benefiber or Metamucil alone or not allowing you to have productive bowel movements every day.  It is important that we prevent constipation moving forward.  Continue to drink at least 64 ounces of water daily, consume plenty of fruits and vegetables daily, and try to to exercise daily.  We will plan to follow-up with you as needed.  I am glad you you are feeling much better overall!  Ermalinda Memos, PA-C [facility]

## 2022-03-13 ENCOUNTER — Telehealth: Payer: Self-pay | Admitting: *Deleted

## 2022-03-13 NOTE — Telephone Encounter (Signed)
Noted  

## 2022-03-13 NOTE — Telephone Encounter (Signed)
LMOM for pt to come and pick up FMLA papers.

## 2022-07-26 DIAGNOSIS — Z114 Encounter for screening for human immunodeficiency virus [HIV]: Secondary | ICD-10-CM | POA: Diagnosis not present

## 2022-07-26 DIAGNOSIS — Z113 Encounter for screening for infections with a predominantly sexual mode of transmission: Secondary | ICD-10-CM | POA: Diagnosis not present

## 2022-07-26 DIAGNOSIS — B009 Herpesviral infection, unspecified: Secondary | ICD-10-CM | POA: Diagnosis not present

## 2023-04-16 DIAGNOSIS — B009 Herpesviral infection, unspecified: Secondary | ICD-10-CM | POA: Diagnosis not present

## 2023-04-16 DIAGNOSIS — Z113 Encounter for screening for infections with a predominantly sexual mode of transmission: Secondary | ICD-10-CM | POA: Diagnosis not present

## 2023-04-23 DIAGNOSIS — Z0000 Encounter for general adult medical examination without abnormal findings: Secondary | ICD-10-CM | POA: Diagnosis not present

## 2023-04-23 DIAGNOSIS — Z0001 Encounter for general adult medical examination with abnormal findings: Secondary | ICD-10-CM | POA: Diagnosis not present

## 2023-04-23 DIAGNOSIS — Z6821 Body mass index (BMI) 21.0-21.9, adult: Secondary | ICD-10-CM | POA: Diagnosis not present

## 2023-05-09 DIAGNOSIS — Z021 Encounter for pre-employment examination: Secondary | ICD-10-CM | POA: Diagnosis not present

## 2023-05-17 DIAGNOSIS — J029 Acute pharyngitis, unspecified: Secondary | ICD-10-CM | POA: Diagnosis not present

## 2023-05-17 DIAGNOSIS — E785 Hyperlipidemia, unspecified: Secondary | ICD-10-CM | POA: Diagnosis not present

## 2023-05-17 DIAGNOSIS — J209 Acute bronchitis, unspecified: Secondary | ICD-10-CM | POA: Diagnosis not present

## 2023-05-17 DIAGNOSIS — B009 Herpesviral infection, unspecified: Secondary | ICD-10-CM | POA: Diagnosis not present

## 2023-05-17 DIAGNOSIS — Z20822 Contact with and (suspected) exposure to covid-19: Secondary | ICD-10-CM | POA: Diagnosis not present

## 2023-09-10 DIAGNOSIS — S62631A Displaced fracture of distal phalanx of left index finger, initial encounter for closed fracture: Secondary | ICD-10-CM | POA: Diagnosis not present
